# Patient Record
Sex: Male | Born: 1965
Health system: Southern US, Community
[De-identification: ages and names within clinical notes are randomized; demographics above are authoritative.]

## PROBLEM LIST (undated history)

## (undated) DIAGNOSIS — E119 Type 2 diabetes mellitus without complications: Secondary | ICD-10-CM

## (undated) DIAGNOSIS — I10 Essential (primary) hypertension: Secondary | ICD-10-CM

## (undated) DIAGNOSIS — F1020 Alcohol dependence, uncomplicated: Secondary | ICD-10-CM

## (undated) DIAGNOSIS — IMO0001 Reserved for inherently not codable concepts without codable children: Secondary | ICD-10-CM

---
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
## 2018-12-23 DIAGNOSIS — I10 Essential (primary) hypertension: Secondary | ICD-10-CM | POA: Diagnosis not present

## 2018-12-23 DIAGNOSIS — E782 Mixed hyperlipidemia: Secondary | ICD-10-CM | POA: Diagnosis not present

## 2018-12-23 DIAGNOSIS — E119 Type 2 diabetes mellitus without complications: Secondary | ICD-10-CM | POA: Diagnosis not present

## 2018-12-23 DIAGNOSIS — Z794 Long term (current) use of insulin: Secondary | ICD-10-CM | POA: Diagnosis not present

## 2019-01-25 DIAGNOSIS — H524 Presbyopia: Secondary | ICD-10-CM | POA: Diagnosis not present

## 2019-02-09 DIAGNOSIS — S39012A Strain of muscle, fascia and tendon of lower back, initial encounter: Secondary | ICD-10-CM | POA: Diagnosis not present

## 2019-10-13 DIAGNOSIS — I10 Essential (primary) hypertension: Secondary | ICD-10-CM | POA: Diagnosis not present

## 2019-10-13 DIAGNOSIS — Z794 Long term (current) use of insulin: Secondary | ICD-10-CM | POA: Diagnosis not present

## 2019-10-13 DIAGNOSIS — Z23 Encounter for immunization: Secondary | ICD-10-CM | POA: Diagnosis not present

## 2019-10-13 DIAGNOSIS — E782 Mixed hyperlipidemia: Secondary | ICD-10-CM | POA: Diagnosis not present

## 2019-10-13 DIAGNOSIS — E119 Type 2 diabetes mellitus without complications: Secondary | ICD-10-CM | POA: Diagnosis not present

## 2019-11-10 DIAGNOSIS — J449 Chronic obstructive pulmonary disease, unspecified: Secondary | ICD-10-CM | POA: Diagnosis not present

## 2019-11-10 DIAGNOSIS — J0100 Acute maxillary sinusitis, unspecified: Secondary | ICD-10-CM | POA: Diagnosis not present

## 2019-11-10 DIAGNOSIS — Z20828 Contact with and (suspected) exposure to other viral communicable diseases: Secondary | ICD-10-CM | POA: Diagnosis not present

## 2019-11-10 DIAGNOSIS — J209 Acute bronchitis, unspecified: Secondary | ICD-10-CM | POA: Diagnosis not present

## 2019-11-14 DIAGNOSIS — J209 Acute bronchitis, unspecified: Secondary | ICD-10-CM | POA: Diagnosis not present

## 2019-11-14 DIAGNOSIS — J441 Chronic obstructive pulmonary disease with (acute) exacerbation: Secondary | ICD-10-CM | POA: Diagnosis not present

## 2019-11-14 DIAGNOSIS — J069 Acute upper respiratory infection, unspecified: Secondary | ICD-10-CM | POA: Diagnosis not present

## 2019-11-17 DIAGNOSIS — R519 Headache, unspecified: Secondary | ICD-10-CM | POA: Diagnosis not present

## 2019-11-17 DIAGNOSIS — Z20828 Contact with and (suspected) exposure to other viral communicable diseases: Secondary | ICD-10-CM | POA: Diagnosis not present

## 2019-11-17 DIAGNOSIS — R6883 Chills (without fever): Secondary | ICD-10-CM | POA: Diagnosis not present

## 2019-11-21 DIAGNOSIS — R079 Chest pain, unspecified: Secondary | ICD-10-CM | POA: Diagnosis not present

## 2019-11-21 DIAGNOSIS — R0602 Shortness of breath: Secondary | ICD-10-CM | POA: Diagnosis not present

## 2019-11-21 DIAGNOSIS — E782 Mixed hyperlipidemia: Secondary | ICD-10-CM | POA: Diagnosis not present

## 2019-11-21 DIAGNOSIS — R002 Palpitations: Secondary | ICD-10-CM | POA: Diagnosis not present

## 2019-11-22 ENCOUNTER — Emergency Department (HOSPITAL_COMMUNITY)
Admission: EM | Admit: 2019-11-22 | Discharge: 2019-11-23 | Disposition: A | Discharge status: Home / Self Care | Payer: BC Managed Care – PPO | Attending: Emergency Medicine | Admitting: Emergency Medicine

## 2019-11-22 ENCOUNTER — Emergency Department (HOSPITAL_COMMUNITY): Payer: BC Managed Care – PPO

## 2019-11-22 ENCOUNTER — Encounter (HOSPITAL_COMMUNITY): Payer: Self-pay

## 2019-11-22 ENCOUNTER — Other Ambulatory Visit: Payer: Self-pay

## 2019-11-22 DIAGNOSIS — F17210 Nicotine dependence, cigarettes, uncomplicated: Secondary | ICD-10-CM | POA: Diagnosis not present

## 2019-11-22 DIAGNOSIS — R0789 Other chest pain: Secondary | ICD-10-CM | POA: Diagnosis not present

## 2019-11-22 DIAGNOSIS — R072 Precordial pain: Secondary | ICD-10-CM | POA: Diagnosis not present

## 2019-11-22 DIAGNOSIS — R05 Cough: Secondary | ICD-10-CM | POA: Insufficient documentation

## 2019-11-22 DIAGNOSIS — I10 Essential (primary) hypertension: Secondary | ICD-10-CM | POA: Diagnosis not present

## 2019-11-22 DIAGNOSIS — R0602 Shortness of breath: Secondary | ICD-10-CM | POA: Insufficient documentation

## 2019-11-22 DIAGNOSIS — E119 Type 2 diabetes mellitus without complications: Secondary | ICD-10-CM | POA: Insufficient documentation

## 2019-11-22 DIAGNOSIS — R079 Chest pain, unspecified: Secondary | ICD-10-CM | POA: Diagnosis not present

## 2019-11-22 HISTORY — DX: Essential (primary) hypertension: I10

## 2019-11-22 HISTORY — DX: Type 2 diabetes mellitus without complications: E11.9

## 2019-11-22 HISTORY — DX: Reserved for inherently not codable concepts without codable children: IMO0001

## 2019-11-22 LAB — CBC
HCT: 51 % (ref 39.0–52.0)
Hemoglobin: 17.2 g/dL — ABNORMAL HIGH (ref 13.0–17.0)
MCH: 31.4 pg (ref 26.0–34.0)
MCHC: 33.7 g/dL (ref 30.0–36.0)
MCV: 93.2 fL (ref 80.0–100.0)
Platelets: 323 10*3/uL (ref 150–400)
RBC: 5.47 MIL/uL (ref 4.22–5.81)
RDW: 13.2 % (ref 11.5–15.5)
WBC: 15.6 10*3/uL — ABNORMAL HIGH (ref 4.0–10.5)
nRBC: 0 % (ref 0.0–0.2)

## 2019-11-22 LAB — BASIC METABOLIC PANEL
Anion gap: 10 (ref 5–15)
BUN: 27 mg/dL — ABNORMAL HIGH (ref 6–20)
CO2: 22 mmol/L (ref 22–32)
Calcium: 9.1 mg/dL (ref 8.9–10.3)
Chloride: 106 mmol/L (ref 98–111)
Creatinine, Ser: 0.84 mg/dL (ref 0.61–1.24)
GFR calc Af Amer: >60 mL/min (ref >60)
GFR calc non Af Amer: >60 mL/min (ref >60)
Glucose, Bld: 126 mg/dL — ABNORMAL HIGH (ref 70–99)
Potassium: 4.2 mmol/L (ref 3.5–5.1)
Sodium: 138 mmol/L (ref 135–145)

## 2019-11-22 LAB — TROPONIN I (HIGH SENSITIVITY): Troponin I (High Sensitivity): 4 ng/L (ref <18)

## 2019-11-22 NOTE — ED Triage Notes (Signed)
Pt c/o Cp and SOB x 2 weeks; saw phys and was dx with bronchitis and took abx. States that the CP has not gotten better so followed up with a cardiologist yesterday and was told that he had had a heart attack. States his next appt was in January but states that he cannot wait until Jan - still having CP.

## 2019-11-23 LAB — TROPONIN I (HIGH SENSITIVITY): Troponin I (High Sensitivity): 4 ng/L (ref <18)

## 2019-11-23 NOTE — Discharge Instructions (Addendum)

## 2019-11-23 NOTE — ED Provider Notes (Signed)
[facility]  CSN: [id number] Arrival date & time: 11/22/19 2003  Chief Complaint(s) Chest Pain  HPI Kyle Fisher is a 53 y.o. male   The history is provided by the patient.  Chest Pain Pain location:  Substernal area Pain quality: aching and dull   Pain radiates to:  Does not radiate Pain severity:  Mild Onset quality:  Gradual Duration:  2 weeks Timing:  Intermittent Progression:  Waxing and waning Chronicity:  New Relieved by:  Nothing Worsened by:  Movement Associated symptoms: cough and shortness of breath   Associated symptoms: no anxiety, no back pain, no fever, no nausea and no vomiting   Risk factors: diabetes mellitus, hypertension, male sex and smoking    Patient reported last chest pain was felt earlier this morning which resolved around 10 AM.  Patient came in because his wife insisted.  He has been chest pain-free since.   Additionally patient reports that he quit drinking alcohol 20 days ago.  He has decreased the amount of smoking that is been doing.  He recently got started on hypertensive medication.  He was seen by cardiology 2 days ago for this chest pain and is scheduled for outpatient work-up.  Past Medical History Past Medical History:  Diagnosis Date  . Alcoholism /alcohol abuse (HCC)    stopped drinking x2 weeks ago  . Diabetes mellitus without complication (HCC)   . Hypertension    There are no problems to display for this patient.  Home Medication(s) Prior to Admission medications   Not on File                                                                                                                                    Past Surgical History Past Surgical History:  Procedure Laterality Date  . TONSILLECTOMY     Family History History reviewed. No pertinent family history.  Social History Social History   Tobacco Use  . Smoking status: Current Every Day Smoker  .  Smokeless tobacco: Current User  Substance Use Topics  . Alcohol use: Not Currently  . Drug use: Never   Allergies Patient has no known allergies.  Review of Systems Review of Systems  Constitutional: Negative for fever.  Respiratory: Positive for cough and shortness of breath.   Cardiovascular: Positive for chest pain.  Gastrointestinal: Negative for nausea and vomiting.  Musculoskeletal: Negative for back pain.   All other systems are reviewed and are negative for acute change except as noted in the HPI  Physical Exam Vital Signs  I have reviewed the triage vital signs BP (!) 142/93   Pulse 88   Temp 98 F (36.7 C) (Oral)   Resp 18   Ht 5\' 9"  (1.753 m)   Wt 94.3 kg   SpO2 96%   BMI 30.72 kg/m   Physical Exam Vitals reviewed.  Constitutional:  General: He is not in acute distress.    Appearance: He is well-developed. He is not diaphoretic.  HENT:     Head: Normocephalic and atraumatic.     Nose: Nose normal.  Eyes:     General: No scleral icterus.       Right eye: No discharge.        Left eye: No discharge.     Conjunctiva/sclera: Conjunctivae normal.     Pupils: Pupils are equal, round, and reactive to light.  Cardiovascular:     Rate and Rhythm: Normal rate and regular rhythm.     Heart sounds: No murmur. No friction rub. No gallop.   Pulmonary:     Effort: Pulmonary effort is normal. No respiratory distress.     Breath sounds: Normal breath sounds. No stridor. No rales.  Abdominal:     General: There is no distension.     Palpations: Abdomen is soft.     Tenderness: There is no abdominal tenderness.  Musculoskeletal:        General: No tenderness.     Cervical back: Normal range of motion and neck supple.  Skin:    General: Skin is warm and dry.     Findings: No erythema or rash.  Neurological:     Mental Status: He is alert and oriented to person, place, and time.     ED Results and Treatments Labs (all labs ordered are listed, but only  abnormal results are displayed) Labs Reviewed  BASIC METABOLIC PANEL - Abnormal; Notable for the following components:      Result Value   Glucose, Bld 126 (*)    BUN 27 (*)    All other components within normal limits  CBC - Abnormal; Notable for the following components:   WBC 15.6 (*)    Hemoglobin 17.2 (*)    All other components within normal limits  TROPONIN I (HIGH SENSITIVITY)  TROPONIN I (HIGH SENSITIVITY)                                                                                                                         EKG  EKG Interpretation  Date/Time:  Wednesday November 22 2019 20:15:43 EST Ventricular Rate:  95 PR Interval:  156 QRS Duration: 66 QT Interval:  322 QTC Calculation: 404 R Axis:   77 Text Interpretation: Sinus rhythm with Fusion complexes Septal infarct , age undetermined Abnormal ECG motion artifact NO STEMI. No old tracing to compare Confirmed by Addison Lank [phone]) on 11/23/2019 4:32:45 AM      Radiology DG Chest 2 View  Result Date: 11/22/2019 CLINICAL DATA:  Chest pain shortness of breath for 2 weeks, recently diagnosed bronchitis EXAM: CHEST - 2 VIEW COMPARISON:  None. FINDINGS: Low lung volumes with streaky opacities favoring atelectasis. No consolidation, features of edema, pneumothorax, or effusion. The cardiomediastinal contours are unremarkable. No acute osseous or soft tissue abnormality. IMPRESSION: Low lung volumes with streaky opacities favoring atelectasis. Otherwise no active cardiopulmonary  disease. Electronically Signed   By: Kreg Shropshire M.D.   On: 11/22/2019 21:29    Pertinent labs & imaging results that were available during my care of the patient were reviewed by me and considered in my medical decision making (see chart for details).  Medications Ordered in ED Medications - No data to display                                                                                                                                    Procedures Procedures  (including critical care time)  Medical Decision Making / ED Course I have reviewed the nursing notes for this encounter and the patient's prior records (if available in EHR or on provided paperwork).   Kyle Fisher was evaluated in Emergency Department on 11/23/2019 for the symptoms described in the history of present illness. He was evaluated in the context of the global COVID-19 pandemic, which necessitated consideration that the patient might be at risk for infection with the SARS-CoV-2 virus that causes COVID-19. Institutional protocols and algorithms that pertain to the evaluation of patients at risk for COVID-19 are in a state of rapid change based on information released by regulatory bodies including the CDC and federal and state organizations. These policies and algorithms were followed during the patient's care in the ED.  Atypical chest pain.  EKG without acute ischemic changes or evidence of pericarditis.  Troponin 16hours after pain resolution was negative.  Do not feel that additional cardiac markers are necessary at this time.  Feel this sufficient to rule out ACS.  Labs notable for leukocytosis which is related to recent steroids provided by urgent care for bronchitis last week.  Chest x-ray without evidence suggestive of pneumonia, pneumothorax, pneumomediastinum.  No abnormal contour of the mediastinum to suggest dissection. No evidence of acute injuries.  Presentation not classic for aortic dissection or esophageal perforation.  Low suspicion for pulmonary embolism.  The patient appears reasonably screened and/or stabilized for discharge and I doubt any other medical condition or other [facility] requiring further screening, evaluation, or treatment in the ED at this time prior to discharge.  The patient is safe for discharge with strict return precautions.       Final Clinical Impression(s) / ED Diagnoses Final diagnoses:  Intermittent chest  pain    The patient appears reasonably screened and/or stabilized for discharge and I doubt any other medical condition or other [facility] requiring further screening, evaluation, or treatment in the ED at this time prior to discharge.  Disposition: Discharge  Condition: Good  I have discussed the results, Dx and Tx plan with the patient who expressed understanding and agree(s) with the plan. Discharge instructions discussed at great length. The patient was given strict return precautions who verbalized understanding of the instructions. No further questions at time of discharge.    ED Discharge Orders    None  Follow Up: Cardiology   as scheduled      This chart was dictated using voice recognition software.  Despite best efforts to proofread,  errors can occur which can change the documentation meaning.   Nira Conn, MD 11/23/19 [phone]

## 2019-11-23 NOTE — ED Notes (Signed)
Patient verbalizes understanding of discharge instructions. Opportunity for questioning and answers were provided. Armband removed by staff, pt discharged from ED. Ambulated out to lobby  

## 2019-12-13 DIAGNOSIS — R002 Palpitations: Secondary | ICD-10-CM | POA: Diagnosis not present

## 2019-12-13 DIAGNOSIS — R0602 Shortness of breath: Secondary | ICD-10-CM | POA: Diagnosis not present

## 2019-12-13 DIAGNOSIS — R079 Chest pain, unspecified: Secondary | ICD-10-CM | POA: Diagnosis not present

## 2020-01-08 IMAGING — DX DG CHEST 2V
2 series · 2 of 2 positions shown · non-contrast
Comparison: None.

CLINICAL DATA: Chest pain shortness of breath for 2 weeks, recently
diagnosed bronchitis

EXAM:
CHEST - 2 VIEW

[chest pa]
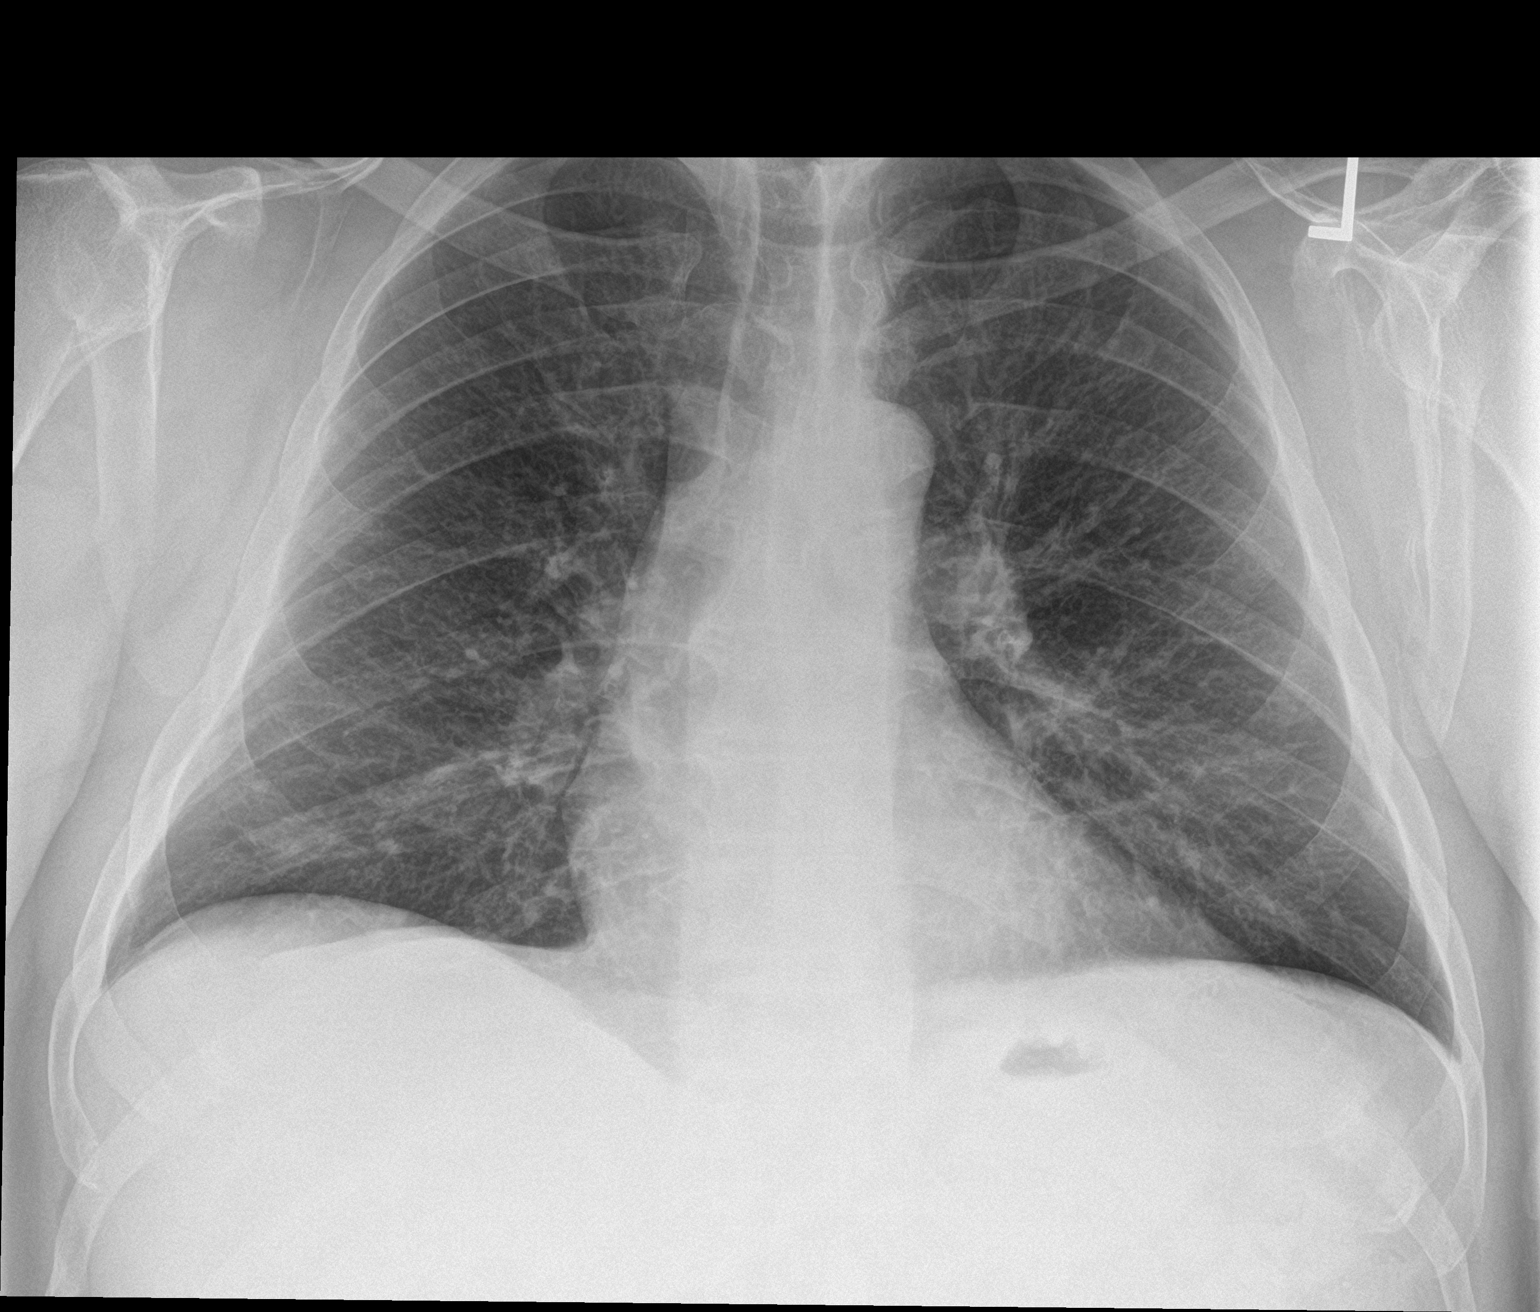

[chest lat]
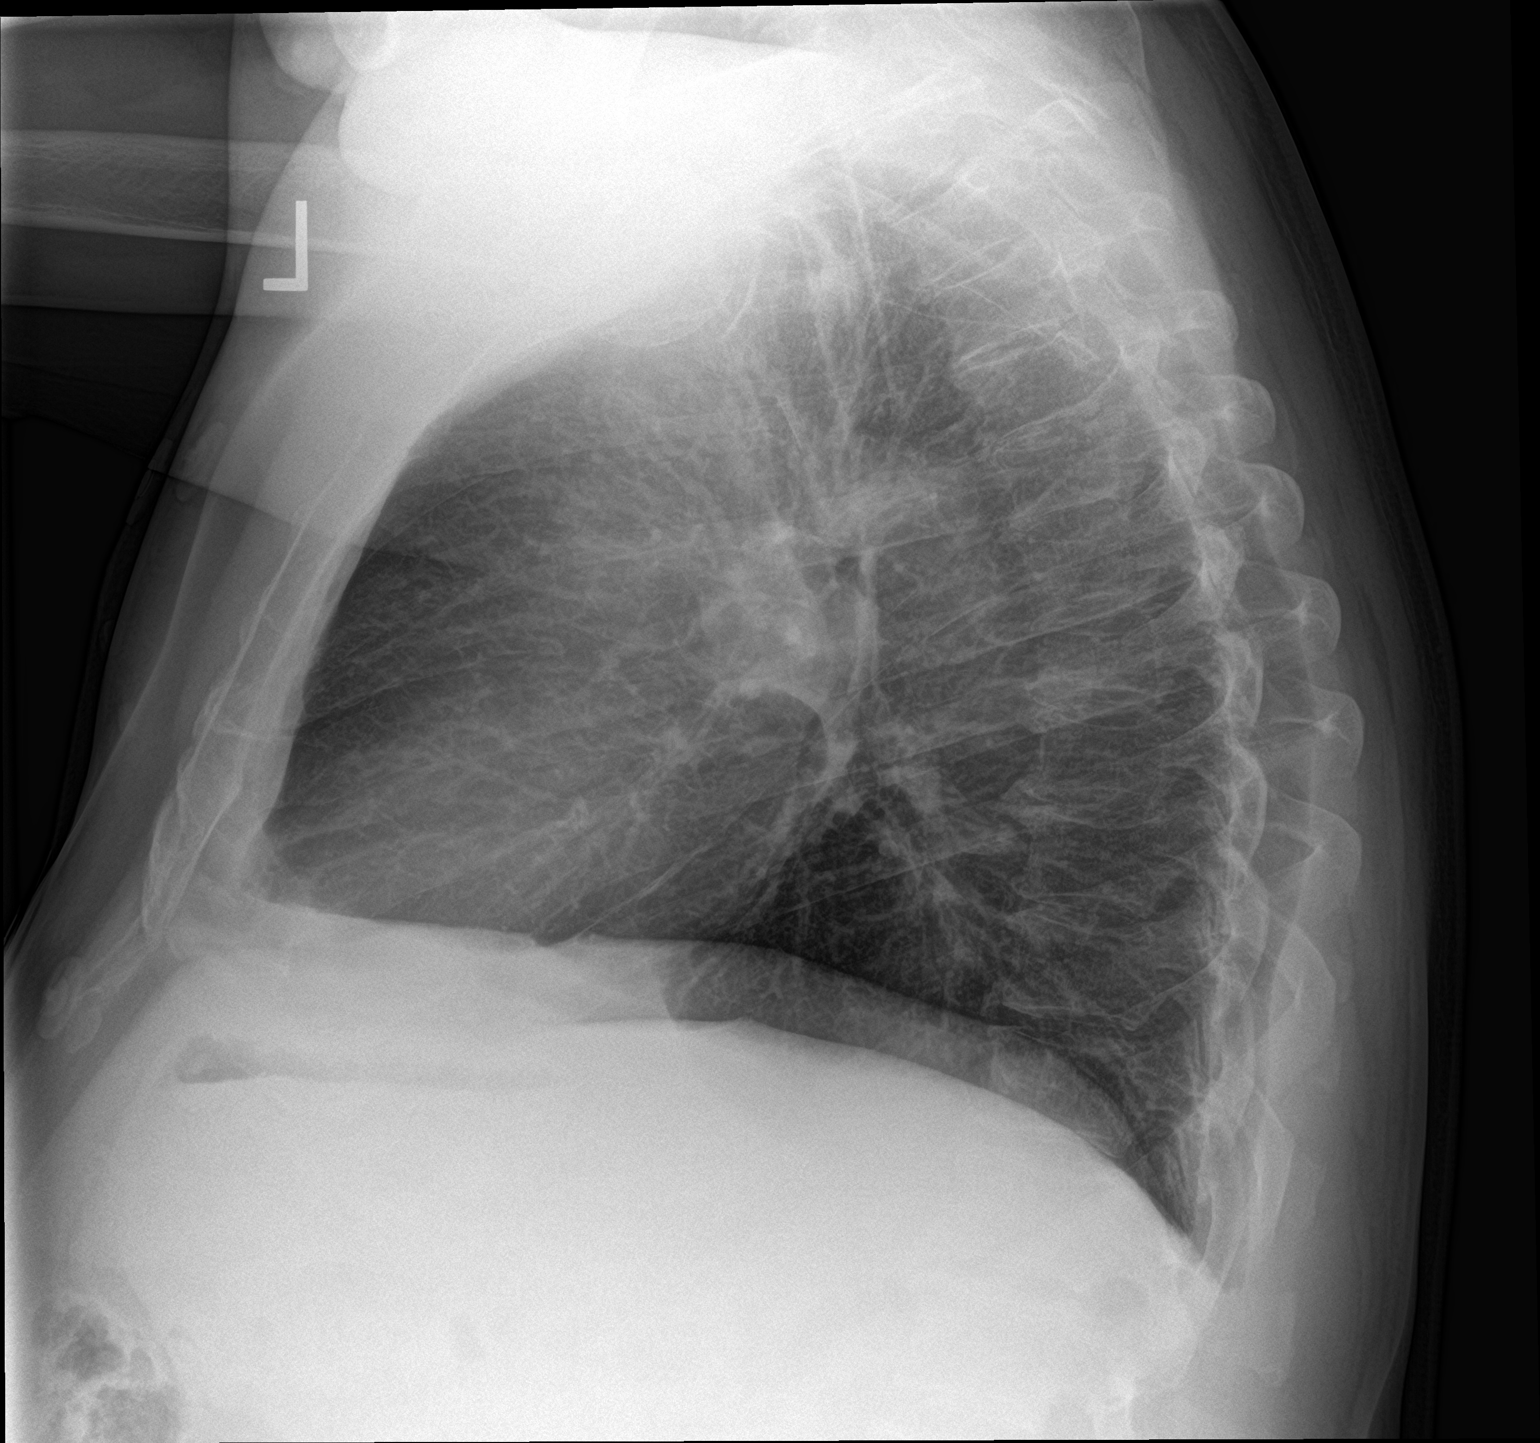

[2 of 2 positions shown; findings below may reference images not displayed]

FINDINGS: Low lung volumes with streaky opacities favoring atelectasis. No
consolidation, features of edema, pneumothorax, or effusion. The
cardiomediastinal contours are unremarkable. No acute osseous or
soft tissue abnormality.
IMPRESSION: Low lung volumes with streaky opacities favoring atelectasis.
Otherwise no active cardiopulmonary disease.

## 2020-01-15 ENCOUNTER — Institutional Professional Consult (permissible substitution): Payer: BC Managed Care – PPO | Admitting: Internal Medicine

## 2020-01-19 ENCOUNTER — Institutional Professional Consult (permissible substitution): Payer: BC Managed Care – PPO | Admitting: Internal Medicine

## 2020-01-30 DIAGNOSIS — E782 Mixed hyperlipidemia: Secondary | ICD-10-CM | POA: Diagnosis not present

## 2020-01-30 DIAGNOSIS — R002 Palpitations: Secondary | ICD-10-CM | POA: Diagnosis not present

## 2020-01-30 DIAGNOSIS — R0602 Shortness of breath: Secondary | ICD-10-CM | POA: Diagnosis not present

## 2020-01-30 DIAGNOSIS — R079 Chest pain, unspecified: Secondary | ICD-10-CM | POA: Diagnosis not present

## 2020-07-03 DIAGNOSIS — K760 Fatty (change of) liver, not elsewhere classified: Secondary | ICD-10-CM | POA: Diagnosis not present

## 2020-07-03 DIAGNOSIS — R1031 Right lower quadrant pain: Secondary | ICD-10-CM | POA: Diagnosis not present

## 2020-07-03 DIAGNOSIS — R1032 Left lower quadrant pain: Secondary | ICD-10-CM | POA: Diagnosis not present

## 2020-07-03 DIAGNOSIS — E1165 Type 2 diabetes mellitus with hyperglycemia: Secondary | ICD-10-CM | POA: Diagnosis not present

## 2020-07-03 DIAGNOSIS — I10 Essential (primary) hypertension: Secondary | ICD-10-CM | POA: Diagnosis not present

## 2020-07-11 DIAGNOSIS — J449 Chronic obstructive pulmonary disease, unspecified: Secondary | ICD-10-CM | POA: Diagnosis not present

## 2020-07-11 DIAGNOSIS — E1165 Type 2 diabetes mellitus with hyperglycemia: Secondary | ICD-10-CM | POA: Diagnosis not present

## 2020-07-11 DIAGNOSIS — R911 Solitary pulmonary nodule: Secondary | ICD-10-CM | POA: Diagnosis not present

## 2020-07-11 DIAGNOSIS — G4700 Insomnia, unspecified: Secondary | ICD-10-CM | POA: Diagnosis not present

## 2020-08-22 ENCOUNTER — Other Ambulatory Visit: Payer: Self-pay | Admitting: Orthopedic Surgery

## 2020-08-22 DIAGNOSIS — M25461 Effusion, right knee: Secondary | ICD-10-CM | POA: Diagnosis not present

## 2020-09-10 DIAGNOSIS — Z720 Tobacco use: Secondary | ICD-10-CM | POA: Diagnosis not present

## 2020-09-10 DIAGNOSIS — R058 Other specified cough: Secondary | ICD-10-CM | POA: Diagnosis not present

## 2020-09-10 DIAGNOSIS — J449 Chronic obstructive pulmonary disease, unspecified: Secondary | ICD-10-CM | POA: Diagnosis not present

## 2020-09-10 DIAGNOSIS — R911 Solitary pulmonary nodule: Secondary | ICD-10-CM | POA: Diagnosis not present

## 2020-09-13 DIAGNOSIS — R911 Solitary pulmonary nodule: Secondary | ICD-10-CM | POA: Diagnosis not present

## 2020-09-27 ENCOUNTER — Other Ambulatory Visit: Payer: Self-pay

## 2020-09-27 ENCOUNTER — Ambulatory Visit
Admission: RE | Admit: 2020-09-27 | Discharge: 2020-09-27 | Disposition: A | Discharge status: Home / Self Care | Payer: BC Managed Care – PPO | Source: Ambulatory Visit | Attending: Orthopedic Surgery | Admitting: Orthopedic Surgery

## 2020-09-27 ENCOUNTER — Other Ambulatory Visit: Payer: BC Managed Care – PPO

## 2020-09-27 DIAGNOSIS — S83511A Sprain of anterior cruciate ligament of right knee, initial encounter: Secondary | ICD-10-CM | POA: Diagnosis not present

## 2020-09-27 DIAGNOSIS — M660 Rupture of popliteal cyst: Secondary | ICD-10-CM | POA: Diagnosis not present

## 2020-09-27 DIAGNOSIS — M25561 Pain in right knee: Secondary | ICD-10-CM

## 2020-09-27 DIAGNOSIS — M25461 Effusion, right knee: Secondary | ICD-10-CM

## 2020-09-27 DIAGNOSIS — S8011XA Contusion of right lower leg, initial encounter: Secondary | ICD-10-CM | POA: Diagnosis not present

## 2020-09-27 DIAGNOSIS — S83241A Other tear of medial meniscus, current injury, right knee, initial encounter: Secondary | ICD-10-CM | POA: Diagnosis not present

## 2020-10-03 DIAGNOSIS — S83511A Sprain of anterior cruciate ligament of right knee, initial encounter: Secondary | ICD-10-CM | POA: Diagnosis not present

## 2020-10-16 DIAGNOSIS — M23221 Derangement of posterior horn of medial meniscus due to old tear or injury, right knee: Secondary | ICD-10-CM | POA: Diagnosis not present

## 2020-10-16 DIAGNOSIS — S83511A Sprain of anterior cruciate ligament of right knee, initial encounter: Secondary | ICD-10-CM | POA: Diagnosis not present
